# Patient Record
Sex: Male | Born: 1945 | Race: Black or African American | Hispanic: No | Marital: Married
Health system: Southern US, Community
[De-identification: ages and names within clinical notes are randomized; demographics above are authoritative.]

---

## 2020-05-11 ENCOUNTER — Other Ambulatory Visit (HOSPITAL_COMMUNITY): Payer: Self-pay | Admitting: Family Medicine

## 2020-05-11 ENCOUNTER — Other Ambulatory Visit: Payer: Self-pay | Admitting: Family Medicine

## 2020-05-11 DIAGNOSIS — R05 Cough: Secondary | ICD-10-CM

## 2020-05-11 DIAGNOSIS — R059 Cough, unspecified: Secondary | ICD-10-CM

## 2020-05-19 ENCOUNTER — Other Ambulatory Visit (HOSPITAL_COMMUNITY): Payer: Self-pay | Admitting: Family Medicine

## 2020-05-19 DIAGNOSIS — Z0189 Encounter for other specified special examinations: Secondary | ICD-10-CM

## 2020-05-19 DIAGNOSIS — F17200 Nicotine dependence, unspecified, uncomplicated: Secondary | ICD-10-CM

## 2020-05-30 ENCOUNTER — Other Ambulatory Visit (HOSPITAL_COMMUNITY): Payer: Self-pay | Admitting: Family Medicine

## 2020-05-30 ENCOUNTER — Other Ambulatory Visit: Payer: Self-pay

## 2020-05-30 ENCOUNTER — Ambulatory Visit (HOSPITAL_COMMUNITY)
Admission: RE | Admit: 2020-05-30 | Discharge: 2020-05-30 | Disposition: A | Discharge status: Home / Self Care | Payer: No Typology Code available for payment source | Source: Ambulatory Visit | Attending: Family Medicine | Admitting: Family Medicine

## 2020-05-30 DIAGNOSIS — Z0189 Encounter for other specified special examinations: Secondary | ICD-10-CM

## 2020-05-30 DIAGNOSIS — F17200 Nicotine dependence, unspecified, uncomplicated: Secondary | ICD-10-CM | POA: Diagnosis not present

## 2020-06-05 ENCOUNTER — Ambulatory Visit (HOSPITAL_COMMUNITY): Admission: RE | Admit: 2020-06-05 | Payer: No Typology Code available for payment source | Source: Ambulatory Visit

## 2020-06-05 ENCOUNTER — Encounter (HOSPITAL_COMMUNITY): Payer: Self-pay

## 2020-12-01 ENCOUNTER — Other Ambulatory Visit: Payer: Self-pay | Admitting: Neurology

## 2020-12-01 ENCOUNTER — Other Ambulatory Visit (HOSPITAL_COMMUNITY): Payer: Self-pay | Admitting: Neurology

## 2020-12-01 DIAGNOSIS — M5412 Radiculopathy, cervical region: Secondary | ICD-10-CM

## 2020-12-14 ENCOUNTER — Other Ambulatory Visit: Payer: Self-pay

## 2020-12-14 ENCOUNTER — Ambulatory Visit (HOSPITAL_COMMUNITY)
Admission: RE | Admit: 2020-12-14 | Discharge: 2020-12-14 | Disposition: A | Discharge status: Home / Self Care | Payer: No Typology Code available for payment source | Source: Ambulatory Visit | Attending: Neurology | Admitting: Neurology

## 2020-12-14 DIAGNOSIS — M5412 Radiculopathy, cervical region: Secondary | ICD-10-CM | POA: Insufficient documentation

## 2021-02-16 ENCOUNTER — Ambulatory Visit (HOSPITAL_COMMUNITY): Payer: No Typology Code available for payment source | Attending: Neurology

## 2021-02-16 ENCOUNTER — Encounter (HOSPITAL_COMMUNITY): Payer: Self-pay

## 2021-02-16 ENCOUNTER — Telehealth (HOSPITAL_COMMUNITY): Payer: Self-pay

## 2021-02-16 ENCOUNTER — Other Ambulatory Visit: Payer: Self-pay

## 2021-02-16 DIAGNOSIS — M25511 Pain in right shoulder: Secondary | ICD-10-CM | POA: Diagnosis present

## 2021-02-16 DIAGNOSIS — R29898 Other symptoms and signs involving the musculoskeletal system: Secondary | ICD-10-CM | POA: Insufficient documentation

## 2021-02-16 DIAGNOSIS — M25611 Stiffness of right shoulder, not elsewhere classified: Secondary | ICD-10-CM | POA: Insufficient documentation

## 2021-02-16 DIAGNOSIS — G8929 Other chronic pain: Secondary | ICD-10-CM | POA: Diagnosis present

## 2021-02-16 NOTE — Patient Instructions (Addendum)
SCAPULAR DEPRESSIONS  Start by squeezing your shoulder blades together. Next, push your arm down towards the floor. As you do this, your shoulder should drop a few inches.   Keep your elbow straight and wrists extended the entire time. Complete 10 times. Multiple times a day.    1) Seated Row   Sit up straight with elbows by your sides. Pull back with shoulders/elbows, keeping forearms straight, as if pulling back on the reins of a horse. Squeeze shoulder blades together. Repeat _10__times, __2-3__sets/day          1) SHOULDER: Flexion On Table   Place hands on towel placed on table, elbows straight. Lean forward with you upper body, pushing towel away from body.  _10__ reps per set, __2-3_ sets per day  2) Abduction (Passive)   With arm out to side, resting on towel placed on table with palm DOWN, keeping trunk away from table, lean to the side while pushing towel away from body.  Repeat _10___ times. Do _2-3___ sessions per day.  Copyright  VHI. All rights reserved.     3) Internal Rotation (Assistive)   Seated with elbow bent at right angle and held against side, slide arm on table surface in an inward arc keeping elbow anchored in place. Repeat __10__ times. Do _2-3___ sessions per day. Activity: Use this motion to brush crumbs off the table.  Copyright  VHI. All rights reserved.

## 2021-02-16 NOTE — Therapy (Signed)
[facility] [address] [place], [place], [address] Phone: [phone]   Fax:  [phone]  Occupational Therapy Evaluation  Patient Details  Name: Jonathan Baldwin MRN: [id number] Date of Birth: May 31, 1946 Referring Provider (OT): Beryle Beams, MD   Encounter Date: 02/16/2021   OT End of Session - 02/16/21 1135    Visit Number 1    Number of Visits 8    Date for OT Re-Evaluation 03/16/21    Authorization Type [facility] - Waiting to hear about authorization. Unknown at time of evaluation.    OT Start Time 0815    OT Stop Time 0900    OT Time Calculation (min) 45 min    Activity Tolerance Patient limited by pain    Behavior During Therapy Anxious           History reviewed. No pertinent past medical history.  History reviewed. No pertinent surgical history.  There were no vitals filed for this visit.   Subjective Assessment - 02/16/21 0822    Subjective  S: I can't move it like I could and it hurts a lot more.    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 Patient reports that he received therapy post CVA and his right arm was functional and able to at least reach to shoulder level without difficulty. He did not have any pain. In the past 5 years his right shoulder began have more pain when he attempted to use it and his ROM decreased as well. Dr. Gerilyn Pilgrim has referred patient to occupational therapy for evaluation and treatment.    Patient Stated Goals To have decreased pain and be able to play golf with better mobility of his right arm.    Currently in Pain? No/denies   no pain. Although when raising his arm to shoulder level it's an 8/10.            [facility] OT Assessment - 02/16/21 0823      Assessment   Medical Diagnosis Right shoulder pain/stiffness    Referring Provider (OT) Beryle Beams, MD    Onset Date/Surgical Date --   approximately 5 years ago  started to get worse.   Hand Dominance Left    Next MD Visit --   in 6 months   Prior Therapy patient received therapy services after his stroke 20 years ago.      Precautions   Precautions None      Restrictions   Weight Bearing Restrictions No      Balance Screen   Has the patient fallen in the past 6 months No      Home  Environment   Family/patient expects to be discharged to: Private residence      Prior Function   Level of Independence Independent    Vocation Retired      ADL   ADL comments Difficulty playing golf, reaching up overhead.      Mobility   Mobility Status Independent      Written Expression   Dominant Hand Left      Vision - History   Baseline Vision No visual deficits      Cognition   Overall Cognitive Status Within Functional Limits for tasks assessed      Observation/Other Assessments   Focus on Therapeutic Outcomes (FOTO)  N/A      Posture/Postural Control   Posture/Postural Control Postural limitations  Postural Limitations Rounded Shoulders;Forward head      ROM / Strength   AROM / PROM / Strength AROM;Strength;PROM      Palpation   Palpation comment Moderate fascial restrictions in the right upper arm upper trapezius, and scapularis region.      AROM   Overall AROM Comments Assessed seated. IR/er adducted    AROM Assessment Site Shoulder    Right/Left Shoulder Right    Right Shoulder Flexion 80 Degrees    Right Shoulder ABduction 75 Degrees    Right Shoulder Internal Rotation 90 Degrees    Right Shoulder External Rotation 40 Degrees      PROM   Overall PROM Comments Assessed supine. IR/er adducted    PROM Assessment Site Shoulder    Right/Left Shoulder Right    Right Shoulder Flexion 142 Degrees    Right Shoulder ABduction 134 Degrees    Right Shoulder Internal Rotation 90 Degrees    Right Shoulder External Rotation 60 Degrees      Strength   Overall Strength Comments Assessed seated. IR/er adducted    Strength  Assessment Site Shoulder    Right/Left Shoulder Right    Right Shoulder Flexion 3-/5    Right Shoulder ABduction 3-/5    Right Shoulder Internal Rotation 3/5    Right Shoulder External Rotation 3-/5                           OT Education - 02/16/21 1121    Education Details table slides, scapular depression, scapular row    Person(s) Educated Patient    Methods Explanation;Demonstration;Handout;Verbal cues;Tactile cues    Comprehension Verbalized understanding;Returned demonstration            OT Short Term Goals - 02/16/21 1144      OT SHORT TERM GOAL #1   Title Patient will be educated and independent with HEP in order to faciliate his progress in therapy and return to his right UE as his nondominant extremity for all daily tasks.    Time 4    Period Weeks    Status New    Target Date 03/16/21      OT SHORT TERM GOAL #2   Title Patient will increase his RUE A/ROM to 50% range as possible in order to increase his ability to swing his golf club with less difficulty.    Time 4    Period Weeks    Status New      OT SHORT TERM GOAL #3   Title Patient will report a decrease in pain level of approximately 4/10 or less when using his RUE during reaching tasks.    Time 4    Period Weeks    Status New      OT SHORT TERM GOAL #4   Title Patient will decrease his right UE fascial restrictions to min amount in order to increase his functional mobility needed to complete reaching at shoulder level.    Time 4    Period Weeks    Status New                    Plan - 02/16/21 1137    Clinical Impression Statement A: Patient is a 75 y/o male S/P right shoulder/neck pain causing decrease ROM and strength. Patient presents with severe muscle guarding and resistance to movement which in turn causes increased pain. Required increased time and VC to relax the RUE enough to measure passive ROM.  Education was provided on the importance of relaxing the arm and  refrain from keeping it in a guarded position. Decreased body awareness noted during evaluation as patient was unaware when it was himself or therapist that was moving his arm. Pt believes that after his rehab post CVA he was able to achieve at least shoulder level height with his UE and did not have pain.    OT Occupational Profile and History Problem Focused Assessment - Including review of records relating to presenting problem    Occupational performance deficits (Please refer to evaluation for details): ADL's;Leisure    Body Structure / Function / Physical Skills ADL;Strength;Pain;ROM;Mobility;Fascial restriction;UE functional use;Tone    Rehab Potential Good    Clinical Decision Making Several treatment options, min-mod task modification necessary    Comorbidities Affecting Occupational Performance: Presence of comorbidities impacting occupational performance    Comorbidities impacting occupational performance description: history of CVA with right side affected.    Modification or Assistance to Complete Evaluation  Min-Moderate modification of tasks or assist with assess necessary to complete eval    OT Frequency 2x / week    OT Duration 4 weeks    OT Treatment/Interventions Self-care/ADL training;Moist Heat;DME and/or AE instruction;Therapeutic activities;Therapeutic exercise;Ultrasound;Cryotherapy;Electrical Stimulation;Energy conservation;Manual Therapy;Patient/family education;Passive range of motion;Neuromuscular education    Plan P: Patient will benefit from skilled OT services to increase functional use of his right UE while completing self care and desired leisure takss. Treatment plan: Myofascial release, manual stretching, P/ROM, AA/ROM, A/ROM, general scapular strengthening. Modalities PRN.    OT Home Exercise Plan eval: table slides, scapular row A/ROM, scapular depression    Consulted and Agree with Plan of Care Patient           Patient will benefit from skilled therapeutic  intervention in order to improve the following deficits and impairments:   Body Structure / Function / Physical Skills: ADL,Strength,Pain,ROM,Mobility,Fascial restriction,UE functional use,Tone       Visit Diagnosis: Other symptoms and signs involving the musculoskeletal system  Stiffness of right shoulder, not elsewhere classified  Chronic right shoulder pain    Problem List There are no problems to display for this patient.   Jonathan Baldwin, OTR/L,CBIS  [phone]  02/16/2021, 11:49 AM  [facility] [address] [place], [place], [address] Phone: [phone]   Fax:  [phone]  Name: Jonathan Baldwin MRN: [id number] Date of Birth: June 08, 1946

## 2021-02-16 NOTE — Telephone Encounter (Signed)
Jonathan Baldwin call back from MD office she has spoken with [facility] - Patient's request is in review to be approved. FOTO (L/m at MD office for Jonathan Baldwin B Ext [phone] -we need [facility] Auth from her office.) [facility]

## 2021-02-21 ENCOUNTER — Ambulatory Visit (HOSPITAL_COMMUNITY): Payer: No Typology Code available for payment source | Admitting: Occupational Therapy

## 2021-02-26 ENCOUNTER — Other Ambulatory Visit: Payer: Self-pay

## 2021-02-26 ENCOUNTER — Ambulatory Visit (HOSPITAL_COMMUNITY): Payer: No Typology Code available for payment source | Attending: Neurology

## 2021-02-26 DIAGNOSIS — G8929 Other chronic pain: Secondary | ICD-10-CM | POA: Diagnosis present

## 2021-02-26 DIAGNOSIS — M25611 Stiffness of right shoulder, not elsewhere classified: Secondary | ICD-10-CM | POA: Insufficient documentation

## 2021-02-26 DIAGNOSIS — M25511 Pain in right shoulder: Secondary | ICD-10-CM | POA: Insufficient documentation

## 2021-02-26 DIAGNOSIS — R29898 Other symptoms and signs involving the musculoskeletal system: Secondary | ICD-10-CM | POA: Diagnosis present

## 2021-02-27 NOTE — Therapy (Signed)
[facility] [address] [place], [place], [address] Phone: [phone]   Fax:  [phone]  Occupational Therapy Treatment  Patient Details  Name: Jonathan Baldwin MRN: [id number] Date of Birth: [birth year] Referring Provider (OT): Beryle Beams, MD   Encounter Date: 02/26/2021   OT End of Session - 02/27/21 0809    Visit Number 2    Number of Visits 8    Date for OT Re-Evaluation 03/16/21    Authorization Type [facility] - Waiting to hear about authorization. Unknown at time of evaluation.    OT Start Time 1645    OT Stop Time 1723    OT Time Calculation (min) 38 min    Activity Tolerance Patient limited by pain;Patient tolerated treatment well    Behavior During Therapy Anxious           No past medical history on file.  No past surgical history on file.  There were no vitals filed for this visit.   Subjective Assessment - 02/26/21 1652    Subjective  S: I haven't been doing my exercises as much; I'll be honest.    Currently in Pain? Yes    Pain Score 4     Pain Location Shoulder    Pain Orientation Right    Pain Descriptors / Indicators Tingling    Pain Type Chronic pain    Pain Radiating Towards to neck    Pain Onset More than a month ago    Pain Frequency Occasional    Aggravating Factors  with increased use.    Pain Relieving Factors exercise    Effect of Pain on Daily Activities severe effect    Multiple Pain Sites No              [facility] OT Assessment - 02/26/21 1653      Assessment   Medical Diagnosis Right shoulder pain/stiffness      Precautions   Precautions None                    OT Treatments/Exercises (OP) - 02/26/21 1654      Exercises   Exercises Shoulder      Shoulder Exercises: Supine   Protraction PROM;AAROM;5 reps    Horizontal ABduction PROM;5 reps    External Rotation PROM;5 reps    Internal Rotation PROM;5 reps    Flexion PROM;5 reps    ABduction PROM;5 reps       Shoulder Exercises: Seated   Extension AROM;10 reps    Row AROM;10 reps    Other Seated Exercises scapular depression; 10X A/ROM      Shoulder Exercises: Therapy Ball   Flexion 10 reps   2" hold at end stretch   ABduction 10 reps   2" hold at end stretch     Shoulder Exercises: ROM/Strengthening   Thumb Tacks 1' low level      Manual Therapy   Manual Therapy Myofascial release    Manual therapy comments Manual therapy completed prior to exercises.    Myofascial Release Myofascial release and manual stretching completed to right upper arm, upper trapezius, and scapularis region to decrease fascial restrictions and increase joint mobility in a pain free zone.                  OT Education - 02/27/21 0809    Education Details reviewed therapy goals    Person(s) Educated Patient    Methods Explanation    Comprehension Verbalized understanding  OT Short Term Goals - 02/26/21 1650      OT SHORT TERM GOAL #1   Title Patient will be educated and independent with HEP in order to faciliate his progress in therapy and return to his right UE as his nondominant extremity for all daily tasks.    Time 4    Period Weeks    Status On-going    Target Date 03/16/21      OT SHORT TERM GOAL #2   Title Patient will increase his RUE A/ROM to 50% range as possible in order to increase his ability to swing his golf club with less difficulty.    Time 4    Period Weeks    Status On-going      OT SHORT TERM GOAL #3   Title Patient will report a decrease in pain level of approximately 4/10 or less when using his RUE during reaching tasks.    Time 4    Period Weeks    Status On-going      OT SHORT TERM GOAL #4   Title Patient will decrease his right UE fascial restrictions to min amount in order to increase his functional mobility needed to complete reaching at shoulder level.    Time 4    Period Weeks    Status On-going                    Plan - 02/27/21  0810    Clinical Impression Statement A: Initiated gentle myofascial release and passive stretching. patient continues to demonstrate muscle guarding and has difficulty with muscle relaxation during P/ROM. VC were provided throughout myofascial release. Continues to demonstrate increased hesitancy with movement in his right UE even when he is completing it. He did demonstrate more range of motion as session progressed. VC for form and technique were provided throughout session.    Body Structure / Function / Physical Skills ADL;Strength;Pain;ROM;Mobility;Fascial restriction;UE functional use;Tone    Plan P: Continue to work on increasing tolerance to movement to RUE. Progress ROM as able gradually. Gentle myofascial release (very sensitive!). Attempt standing table wash.    Consulted and Agree with Plan of Care Patient           Patient will benefit from skilled therapeutic intervention in order to improve the following deficits and impairments:   Body Structure / Function / Physical Skills: ADL,Strength,Pain,ROM,Mobility,Fascial restriction,UE functional use,Tone       Visit Diagnosis: Other symptoms and signs involving the musculoskeletal system  Stiffness of right shoulder, not elsewhere classified  Chronic right shoulder pain    Problem List There are no problems to display for this patient.   Limmie Patricia, OTR/L,CBIS  [phone]  02/27/2021, 8:16 AM   [facility] [address] [place], [place], [address] Phone: [phone]   Fax:  [phone]  Name: Jonathan Baldwin MRN: [id number] Date of Birth: 1946-02-04

## 2021-02-28 ENCOUNTER — Other Ambulatory Visit: Payer: Self-pay

## 2021-02-28 ENCOUNTER — Encounter (HOSPITAL_COMMUNITY): Payer: Self-pay | Admitting: Occupational Therapy

## 2021-02-28 ENCOUNTER — Ambulatory Visit (HOSPITAL_COMMUNITY): Payer: No Typology Code available for payment source | Admitting: Occupational Therapy

## 2021-02-28 DIAGNOSIS — M25611 Stiffness of right shoulder, not elsewhere classified: Secondary | ICD-10-CM

## 2021-02-28 DIAGNOSIS — R29898 Other symptoms and signs involving the musculoskeletal system: Secondary | ICD-10-CM

## 2021-02-28 DIAGNOSIS — M25511 Pain in right shoulder: Secondary | ICD-10-CM

## 2021-02-28 NOTE — Therapy (Signed)
[facility] [address] [place], [place], [address] Phone: [phone]   Fax:  [phone]  Occupational Therapy Treatment  Patient Details  Name: Jonathan Baldwin MRN: [id number] Date of Birth: 05/23/1946 Referring Provider (OT): Beryle Beams, MD   Encounter Date: 02/28/2021   OT End of Session - 02/28/21 0938    Visit Number 3    Number of Visits 8    Date for OT Re-Evaluation 03/16/21    Authorization Type [facility] - Waiting to hear about authorization. Unknown at time of evaluation.    OT Start Time 0900    OT Stop Time 0938    OT Time Calculation (min) 38 min    Activity Tolerance Patient limited by pain;Patient tolerated treatment well    Behavior During Therapy Anxious           History reviewed. No pertinent past medical history.  History reviewed. No pertinent surgical history.  There were no vitals filed for this visit.   Subjective Assessment - 02/28/21 0859    Subjective  S: It's just feeling stiff.    Currently in Pain? No/denies              [facility] OT Assessment - 02/28/21 0859      Assessment   Medical Diagnosis Right shoulder pain/stiffness      Precautions   Precautions None                    OT Treatments/Exercises (OP) - 02/28/21 0859      Exercises   Exercises Shoulder      Shoulder Exercises: Supine   Protraction PROM;5 reps;AAROM;10 reps    Horizontal ABduction PROM;5 reps    External Rotation PROM;5 reps;AAROM;10 reps    Internal Rotation PROM;5 reps;AAROM;10 reps    Flexion PROM;AAROM;5 reps    ABduction PROM;5 reps      Shoulder Exercises: Seated   Extension AROM;10 reps    Row AROM;10 reps    Other Seated Exercises scapular depression; 10X A/ROM      Shoulder Exercises: Standing   Other Standing Exercises standing table wash, 1'      Shoulder Exercises: Therapy Ball   Flexion 10 reps    ABduction 10 reps      Manual Therapy   Manual Therapy Myofascial  release    Manual therapy comments Manual therapy completed prior to exercises.    Myofascial Release Myofascial release and manual stretching completed to right upper arm, upper trapezius, and scapularis region to decrease fascial restrictions and increase joint mobility in a pain free zone.                    OT Short Term Goals - 02/26/21 1650      OT SHORT TERM GOAL #1   Title Patient will be educated and independent with HEP in order to faciliate his progress in therapy and return to his right UE as his nondominant extremity for all daily tasks.    Time 4    Period Weeks    Status On-going    Target Date 03/16/21      OT SHORT TERM GOAL #2   Title Patient will increase his RUE A/ROM to 50% range as possible in order to increase his ability to swing his golf club with less difficulty.    Time 4    Period Weeks    Status On-going      OT SHORT TERM GOAL #  3   Title Patient will report a decrease in pain level of approximately 4/10 or less when using his RUE during reaching tasks.    Time 4    Period Weeks    Status On-going      OT SHORT TERM GOAL #4   Title Patient will decrease his right UE fascial restrictions to min amount in order to increase his functional mobility needed to complete reaching at shoulder level.    Time 4    Period Weeks    Status On-going                    Plan - 02/28/21 0350    Clinical Impression Statement A: Continued gentle myofascial release to address fascial restrictions, passive stretching. Pt with muscle guarding however was able to relax somewhat with consistent verbal cuing. Continued with AA/ROM in supine, increasing to 10 reps for protraction, adding flexion and er/IR today. Pt with improvement in smoothness of movement however continues to grip the dowel rod very tightly which increased his pain with movement. Added standing table wash, continued with therapy ball stretches. Verbal cuing for form and technique.    Body  Structure / Function / Physical Skills ADL;Strength;Pain;ROM;Mobility;Fascial restriction;UE functional use;Tone    Plan P: Continue with manual techniques and passive stretching, AA/ROM working towards 10 reps of each task, attempt pulleys    OT Home Exercise Plan eval: table slides, scapular row A/ROM, scapular depression    Consulted and Agree with Plan of Care Patient           Patient will benefit from skilled therapeutic intervention in order to improve the following deficits and impairments:   Body Structure / Function / Physical Skills: ADL,Strength,Pain,ROM,Mobility,Fascial restriction,UE functional use,Tone       Visit Diagnosis: Other symptoms and signs involving the musculoskeletal system  Stiffness of right shoulder, not elsewhere classified  Chronic right shoulder pain    Problem List There are no problems to display for this patient.  Ezra Sites, OTR/L  [phone] 02/28/2021, 9:39 AM  [facility] [address] [place], [place], [address] Phone: [phone]   Fax:  [phone]  Name: Jonathan Baldwin MRN: [id number] Date of Birth: December 26, 1945

## 2021-03-05 ENCOUNTER — Other Ambulatory Visit: Payer: Self-pay

## 2021-03-05 ENCOUNTER — Ambulatory Visit (HOSPITAL_COMMUNITY): Payer: No Typology Code available for payment source

## 2021-03-05 ENCOUNTER — Encounter (HOSPITAL_COMMUNITY): Payer: Self-pay

## 2021-03-05 DIAGNOSIS — R29898 Other symptoms and signs involving the musculoskeletal system: Secondary | ICD-10-CM

## 2021-03-05 DIAGNOSIS — M25611 Stiffness of right shoulder, not elsewhere classified: Secondary | ICD-10-CM

## 2021-03-05 DIAGNOSIS — M25511 Pain in right shoulder: Secondary | ICD-10-CM

## 2021-03-05 NOTE — Therapy (Signed)
[facility] [address] [place], [place], [address] Phone: [phone]   Fax:  [phone]  Occupational Therapy Treatment  Patient Details  Name: Jonathan Baldwin MRN: [id number] Date of Birth: Nov 20, 1945 Referring Provider (OT): Beryle Beams, MD   Encounter Date: 03/05/2021   OT End of Session - 03/05/21 1027     Visit Number 4    Number of Visits 8    Date for OT Re-Evaluation 03/16/21    Authorization Type [facility] - Waiting to hear about authorization. Unknown at time of evaluation.    OT Start Time [phone]    OT Stop Time 1025    OT Time Calculation (min) 38 min    Activity Tolerance Patient tolerated treatment well    Behavior During Therapy [facility] for tasks assessed/performed             History reviewed. No pertinent past medical history.  History reviewed. No pertinent surgical history.  There were no vitals filed for this visit.   Subjective Assessment - 03/05/21 1003     Subjective  S: I didn't even realize it but I used this arm to reach into the fridge for something the other day.    Currently in Pain? No/denies                [facility] OT Assessment - 03/05/21 1004       Assessment   Medical Diagnosis Right shoulder pain/stiffness      Precautions   Precautions None                      OT Treatments/Exercises (OP) - 03/05/21 1004       Exercises   Exercises Shoulder      Shoulder Exercises: Supine   Protraction PROM;5 reps;AAROM;10 reps    Horizontal ABduction PROM;5 reps;AAROM;10 reps    External Rotation PROM;5 reps;AAROM;10 reps    Internal Rotation PROM;5 reps;AAROM;10 reps    Flexion PROM;5 reps;AAROM;10 reps    ABduction PROM;5 reps      Shoulder Exercises: Standing   ABduction AAROM;10 reps      Shoulder Exercises: Pulleys   Flexion 1 minute   standing   ABduction 1 minute   standing     Shoulder Exercises: Therapy Ball   Flexion 10 reps   2" hold at end  stretch   ABduction 10 reps   2" hold at end stretch     Manual Therapy   Manual Therapy Myofascial release    Manual therapy comments Manual therapy completed prior to exercises.    Myofascial Release Myofascial release and manual stretching completed to right upper arm, upper trapezius, and scapularis region to decrease fascial restrictions and increase joint mobility in a pain free zone.                    OT Education - 03/05/21 1027     Education Details AA/ROM supine except Abduction complete standing.    Person(s) Educated Patient    Methods Explanation;Demonstration;Verbal cues;Handout    Comprehension Returned demonstration;Verbalized understanding              OT Short Term Goals - 02/26/21 1650       OT SHORT TERM GOAL #1   Title Patient will be educated and independent with HEP in order to faciliate his progress in therapy and return to his right UE as his nondominant extremity for all daily tasks.  Time 4    Period Weeks    Status On-going    Target Date 03/16/21      OT SHORT TERM GOAL #2   Title Patient will increase his RUE A/ROM to 50% range as possible in order to increase his ability to swing his golf club with less difficulty.    Time 4    Period Weeks    Status On-going      OT SHORT TERM GOAL #3   Title Patient will report a decrease in pain level of approximately 4/10 or less when using his RUE during reaching tasks.    Time 4    Period Weeks    Status On-going      OT SHORT TERM GOAL #4   Title Patient will decrease his right UE fascial restrictions to min amount in order to increase his functional mobility needed to complete reaching at shoulder level.    Time 4    Period Weeks    Status On-going                      Plan - 03/05/21 1028     Clinical Impression Statement A: Pt demonstrates further P/ROM range this session with less muscle guarding and resistance. Required VC to relax as needed approximately 10% of  the time. Able to complete all AA/ROM supine except for abduction which was completed standing for better form and technique. Added pulleys with tactile cueing to maintain proper form and technique. Upgraded HEP for AA/ROM.    Body Structure / Function / Physical Skills ADL;Strength;Pain;ROM;Mobility;Fascial restriction;UE functional use;Tone    Plan P: Follow up on HEP. Continue with AA/ROM supine. Attempt wall wash or resume table top wash.    OT Home Exercise Plan eval: table slides, scapular row A/ROM, scapular depression 6/13: AA/ROM    Consulted and Agree with Plan of Care Patient             Patient will benefit from skilled therapeutic intervention in order to improve the following deficits and impairments:   Body Structure / Function / Physical Skills: ADL, Strength, Pain, ROM, Mobility, Fascial restriction, UE functional use, Tone       Visit Diagnosis: Chronic right shoulder pain  Other symptoms and signs involving the musculoskeletal system  Stiffness of right shoulder, not elsewhere classified    Problem List There are no problems to display for this patient.   Limmie Patricia, OTR/L,CBIS  [phone]   03/05/2021, 10:30 AM  [facility] [address] [place], [place], [address] Phone: [phone]   Fax:  [phone]  Name: Jonathan Baldwin MRN: [id number] Date of Birth: 1946/03/06

## 2021-03-05 NOTE — Patient Instructions (Signed)
Perform each exercise ____10____ reps. 1-2x days.  DO them laying except for the last one.   1) Protraction   Start by holding a wand or cane at chest height.  Next, slowly push the wand outwards in front of your body so that your elbows become fully straightened. Then, return to the original position.     2) Shoulder FLEXION   In the standing position, hold a wand/cane with both arms, palms down on both sides. Raise up the wand/cane allowing your unaffected arm to perform most of the effort. Your affected arm should be partially relaxed.      3) Internal/External ROTATION   In the standing position, hold a wand/cane with both hands keeping your elbows bent. Move your arms and wand/cane to one side.  Your affected arm should be partially relaxed while your unaffected arm performs most of the effort.           5) Horizontal Abduction/Adduction      Straight arms holding cane at shoulder height, bring cane to right, center, left. Repeat starting to left.   Copyright  [employer]. All rights reserved.    ) Shoulder ABDUCTION - stand up for this one.  While holding a wand/cane palm face up on the injured side and palm face down on the uninjured side, slowly raise up your injured arm to the side.

## 2021-03-07 ENCOUNTER — Encounter (HOSPITAL_COMMUNITY): Payer: Self-pay

## 2021-03-07 ENCOUNTER — Ambulatory Visit (HOSPITAL_COMMUNITY): Payer: No Typology Code available for payment source

## 2021-03-07 ENCOUNTER — Other Ambulatory Visit: Payer: Self-pay

## 2021-03-07 DIAGNOSIS — R29898 Other symptoms and signs involving the musculoskeletal system: Secondary | ICD-10-CM | POA: Diagnosis not present

## 2021-03-07 DIAGNOSIS — M25511 Pain in right shoulder: Secondary | ICD-10-CM

## 2021-03-07 DIAGNOSIS — M25611 Stiffness of right shoulder, not elsewhere classified: Secondary | ICD-10-CM

## 2021-03-07 NOTE — Therapy (Signed)
[facility] [address] [place], [place], [address] Phone: [phone]   Fax:  [phone]  Occupational Therapy Treatment  Patient Details  Name: Jonathan Baldwin MRN: [id number] Date of Birth: 06/29/1946 Referring Provider (OT): Beryle Beams, MD   Encounter Date: 03/07/2021   OT End of Session - 03/07/21 1012     Visit Number 5    Number of Visits 8    Date for OT Re-Evaluation 03/16/21    Authorization Type [facility] - Waiting to hear about authorization. Unknown at time of evaluation.    OT Start Time 0945    OT Stop Time 1023    OT Time Calculation (min) 38 min    Activity Tolerance Patient tolerated treatment well    Behavior During Therapy [facility] for tasks assessed/performed             History reviewed. No pertinent past medical history.  History reviewed. No pertinent surgical history.  There were no vitals filed for this visit.   Subjective Assessment - 03/07/21 0948     Subjective  S: I can tell it's getting so much better. I'm actually trying to move it.    Currently in Pain? Yes    Pain Score 5     Pain Location Shoulder    Pain Orientation Right    Pain Descriptors / Indicators Sore    Pain Type Chronic pain    Pain Onset Today    Aggravating Factors  way he slept possibly    Pain Relieving Factors exercise    Effect of Pain on Daily Activities mod effect    Multiple Pain Sites No                [facility] OT Assessment - 03/07/21 1006       Assessment   Medical Diagnosis Right shoulder pain/stiffness      Precautions   Precautions None                      OT Treatments/Exercises (OP) - 03/07/21 1006       Exercises   Exercises Shoulder      Shoulder Exercises: Supine   Protraction PROM;5 reps;AAROM;10 reps    Horizontal ABduction PROM;5 reps;AAROM;10 reps    External Rotation PROM;5 reps;AAROM;10 reps    Internal Rotation PROM;5 reps;AAROM;10 reps    Flexion PROM;5  reps;AAROM;10 reps    ABduction PROM;5 reps      Shoulder Exercises: Standing   ABduction AAROM;10 reps      Shoulder Exercises: Pulleys   Flexion 1 minute   standing   ABduction 1 minute   standing     Shoulder Exercises: Therapy Ball   Flexion --   12X with 2" hold at end stretch   ABduction --   12X with 2" hold at end stretch     Shoulder Exercises: ROM/Strengthening   Wall Wash 1'    Other ROM/Strengthening Exercises PVC pipe slide 10X flexion      Manual Therapy   Manual Therapy Myofascial release    Manual therapy comments Manual therapy completed prior to exercises.    Myofascial Release Myofascial release and manual stretching completed to right upper arm, upper trapezius, and scapularis region to decrease fascial restrictions and increase joint mobility in a pain free zone.                      OT Short Term Goals - 02/26/21  1650       OT SHORT TERM GOAL #1   Title Patient will be educated and independent with HEP in order to faciliate his progress in therapy and return to his right UE as his nondominant extremity for all daily tasks.    Time 4    Period Weeks    Status On-going    Target Date 03/16/21      OT SHORT TERM GOAL #2   Title Patient will increase his RUE A/ROM to 50% range as possible in order to increase his ability to swing his golf club with less difficulty.    Time 4    Period Weeks    Status On-going      OT SHORT TERM GOAL #3   Title Patient will report a decrease in pain level of approximately 4/10 or less when using his RUE during reaching tasks.    Time 4    Period Weeks    Status On-going      OT SHORT TERM GOAL #4   Title Patient will decrease his right UE fascial restrictions to min amount in order to increase his functional mobility needed to complete reaching at shoulder level.    Time 4    Period Weeks    Status On-going                      Plan - 03/07/21 1029     Clinical Impression Statement A:  Pt continues to demonstrate improvement with P/ROM as he is able to tolerate ROM that is [facility] when passively stretched. Does has some discomfort at end stretch. Able to complete wall wash and pvc pipe slide while reaching almost shoulder level height for range. Manual techniques were completed to address min fascial restrictions at the right upper arm, anterior deltoid, AC joint, and upper trapezius. VC for form and technique were provided during session.    Body Structure / Function / Physical Skills ADL;Strength;Pain;ROM;Mobility;Fascial restriction;UE functional use;Tone    Plan P: Attempt standing AA/ROM with use of mirror for visual feedback on posture.    Consulted and Agree with Plan of Care Patient             Patient will benefit from skilled therapeutic intervention in order to improve the following deficits and impairments:   Body Structure / Function / Physical Skills: ADL, Strength, Pain, ROM, Mobility, Fascial restriction, UE functional use, Tone       Visit Diagnosis: Stiffness of right shoulder, not elsewhere classified  Other symptoms and signs involving the musculoskeletal system  Chronic right shoulder pain    Problem List There are no problems to display for this patient.   Limmie Patricia, OTR/L,CBIS  [phone]   03/07/2021, 10:32 AM  [facility] [address] [place], [place], [address] Phone: [phone]   Fax:  [phone]  Name: Kiara Keep MRN: [id number] Date of Birth: 01-07-1946

## 2021-03-12 ENCOUNTER — Encounter (HOSPITAL_COMMUNITY): Payer: Self-pay

## 2021-03-12 ENCOUNTER — Ambulatory Visit (HOSPITAL_COMMUNITY): Payer: No Typology Code available for payment source

## 2021-03-12 ENCOUNTER — Other Ambulatory Visit: Payer: Self-pay

## 2021-03-12 DIAGNOSIS — R29898 Other symptoms and signs involving the musculoskeletal system: Secondary | ICD-10-CM

## 2021-03-12 DIAGNOSIS — M25511 Pain in right shoulder: Secondary | ICD-10-CM

## 2021-03-12 DIAGNOSIS — M25611 Stiffness of right shoulder, not elsewhere classified: Secondary | ICD-10-CM

## 2021-03-12 NOTE — Therapy (Signed)
[facility] [address] [place], [place], [address] Phone: [phone]   Fax:  [phone]  Occupational Therapy Treatment  Patient Details  Name: Jonathan Baldwin MRN: [id number] Date of Birth: September 29, 1945 Referring Provider (OT): Beryle Beams, MD   Encounter Date: 03/12/2021   OT End of Session - 03/12/21 0935     Visit Number 6    Number of Visits 8    Date for OT Re-Evaluation 03/16/21    Authorization Type [facility] - Waiting to hear about authorization. Unknown at time of evaluation.    OT Start Time 0900    OT Stop Time 0938    OT Time Calculation (min) 38 min    Activity Tolerance Patient tolerated treatment well    Behavior During Therapy [facility] for tasks assessed/performed             History reviewed. No pertinent past medical history.  No past surgical history on file.  There were no vitals filed for this visit.   Subjective Assessment - 03/12/21 0903     Subjective  S; It's feeling good.    Currently in Pain? Yes    Pain Score 5     Pain Location Shoulder    Pain Orientation Right    Pain Descriptors / Indicators Sore    Pain Type Chronic pain    Pain Onset Yesterday    Pain Frequency Occasional    Aggravating Factors  nothing noticeable    Pain Relieving Factors exercise    Effect of Pain on Daily Activities min effect    Multiple Pain Sites No                [facility] OT Assessment - 03/12/21 0934       Assessment   Medical Diagnosis Right shoulder pain/stiffness      Precautions   Precautions None                      OT Treatments/Exercises (OP) - 03/12/21 0916       Exercises   Exercises Shoulder      Shoulder Exercises: Supine   Protraction PROM;5 reps    Horizontal ABduction PROM;5 reps    External Rotation PROM;5 reps    Internal Rotation PROM;5 reps    Flexion PROM;5 reps    ABduction PROM;5 reps      Shoulder Exercises: Standing   Protraction AAROM;10  reps    Horizontal ABduction AAROM;10 reps;Limitations    Horizontal ABduction Limitations lower level    External Rotation AAROM;10 reps    Internal Rotation AAROM;10 reps    Flexion AAROM;10 reps;Limitations    Flexion Limitations just under shoulder level    ABduction AAROM;10 reps      Shoulder Exercises: ROM/Strengthening   UBE (Upper Arm Bike) Level 1 2' forward 2' reverse paceL 3.0-4.0    Wall Wash 1'    Over Head Lace seated. Laced chain from highest level of reach and then removed. Rest breaks taken as needed.      Manual Therapy   Manual Therapy Myofascial release    Manual therapy comments Manual therapy completed prior to exercises.    Myofascial Release Myofascial release and manual stretching completed to right upper arm, upper trapezius, and scapularis region to decrease fascial restrictions and increase joint mobility in a pain free zone.                    OT  Education - 03/12/21 0935     Education Details Complete AA/ROM standing now.    Person(s) Educated Patient    Methods Explanation;Demonstration    Comprehension Verbalized understanding;Returned demonstration              OT Short Term Goals - 02/26/21 1650       OT SHORT TERM GOAL #1   Title Patient will be educated and independent with HEP in order to faciliate his progress in therapy and return to his right UE as his nondominant extremity for all daily tasks.    Time 4    Period Weeks    Status On-going    Target Date 03/16/21      OT SHORT TERM GOAL #2   Title Patient will increase his RUE A/ROM to 50% range as possible in order to increase his ability to swing his golf club with less difficulty.    Time 4    Period Weeks    Status On-going      OT SHORT TERM GOAL #3   Title Patient will report a decrease in pain level of approximately 4/10 or less when using his RUE during reaching tasks.    Time 4    Period Weeks    Status On-going      OT SHORT TERM GOAL #4   Title  Patient will decrease his right UE fascial restrictions to min amount in order to increase his functional mobility needed to complete reaching at shoulder level.    Time 4    Period Weeks    Status On-going                      Plan - 03/12/21 0935     Clinical Impression Statement A: Manual techniques completed to address min fascial restrictions in the right upper arm and upper trapezius region. Demonstrates functional P/ROM this date. Able to complete UBE bike and overhead lacing with moderate difficulty due to muscle fatigue.    Body Structure / Function / Physical Skills ADL;Strength;Pain;ROM;Mobility;Fascial restriction;UE functional use;Tone    Plan P: Reassessment. Discharge with HEP if appropriate. Complete FOTO and review goals.    Consulted and Agree with Plan of Care Patient             Patient will benefit from skilled therapeutic intervention in order to improve the following deficits and impairments:   Body Structure / Function / Physical Skills: ADL, Strength, Pain, ROM, Mobility, Fascial restriction, UE functional use, Tone       Visit Diagnosis: Stiffness of right shoulder, not elsewhere classified  Chronic right shoulder pain  Other symptoms and signs involving the musculoskeletal system    Problem List There are no problems to display for this patient.   Limmie Patricia, OTR/L,CBIS  [phone]   03/12/2021, 9:38 AM  [facility] [address] [place], [place], [address] Phone: [phone]   Fax:  [phone]  Name: Jonathan Baldwin MRN: [id number] Date of Birth: [birth year]

## 2021-03-13 ENCOUNTER — Telehealth (HOSPITAL_COMMUNITY): Payer: Self-pay

## 2021-03-13 NOTE — Telephone Encounter (Signed)
Aram Beecham called again today -checking on AUTH for OT [place] [employer]) she tried to fax it again and it did not come through. Updated IT Ticket and as Aram Beecham to send the authorization by mail.

## 2021-03-14 ENCOUNTER — Other Ambulatory Visit: Payer: Self-pay

## 2021-03-14 ENCOUNTER — Ambulatory Visit (HOSPITAL_COMMUNITY): Payer: No Typology Code available for payment source

## 2021-03-14 ENCOUNTER — Encounter (HOSPITAL_COMMUNITY): Payer: Self-pay

## 2021-03-14 DIAGNOSIS — R29898 Other symptoms and signs involving the musculoskeletal system: Secondary | ICD-10-CM

## 2021-03-14 DIAGNOSIS — M25611 Stiffness of right shoulder, not elsewhere classified: Secondary | ICD-10-CM

## 2021-03-14 DIAGNOSIS — G8929 Other chronic pain: Secondary | ICD-10-CM

## 2021-03-14 NOTE — Therapy (Signed)
[place], [place], [address] Phone: [phone]   Fax:  [phone]  Occupational Therapy Treatment Reassessment/discharge Patient Details  Name: Jonathan Baldwin MRN: [id number] Date of Birth: 12/02/1945 Referring Provider (OT): Phillips Odor, MD   Encounter Date: 03/14/2021   OT End of Session - 03/14/21 1051     Visit Number 7    Number of Visits 8    Date for OT Re-Evaluation 03/16/21    Authorization Type [place] - Waiting to hear about authorization. Unknown at time of evaluation.    OT Start Time [phone]   reassess and discharge   OT Stop Time 1020    OT Time Calculation (min) 30 min    Activity Tolerance Patient tolerated treatment well    Behavior During Therapy [facility] for tasks assessed/performed             History reviewed. No pertinent past medical history.  No past surgical history on file.  There were no vitals filed for this visit.   Subjective Assessment - 03/14/21 0954     Subjective  S: I played the best golf game I have played in two years. I played a 79.    Currently in Pain? No/denies                [facility] OT Assessment - 03/14/21 0954       Assessment   Medical Diagnosis Right shoulder pain/stiffness    Referring Provider (OT) Phillips Odor, MD      Precautions   Precautions None      Prior Function   Level of Independence Independent      ROM / Strength   AROM / PROM / Strength AROM;PROM;Strength      Palpation   Palpation comment min fascial restrictions noted in right upper arm, upper trapezius, and scapularis region.      AROM   Overall AROM Comments Assessed seated. IR/er adducted    AROM Assessment Site Shoulder    Right/Left Shoulder Right    Right Shoulder Flexion 101 Degrees   previous: 80   Right Shoulder ABduction 90 Degrees   previous: 75   Right Shoulder Internal Rotation 90 Degrees   previous: same   Right Shoulder External Rotation 46  Degrees   previous: 40     PROM   Overall PROM Comments Assessed supine. IR/er adducted    PROM Assessment Site Shoulder    Right/Left Shoulder Right    Right Shoulder Flexion 160 Degrees   previous: 142   Right Shoulder ABduction 180 Degrees   previous:134   Right Shoulder Internal Rotation 90 Degrees   previous: 90   Right Shoulder External Rotation 70 Degrees   previous: 60     Strength   Overall Strength Comments Assessed seated. IR/er adducted    Strength Assessment Site Shoulder    Right/Left Shoulder Right    Right Shoulder Flexion 3+/5   previous: 3-/5   Right Shoulder ABduction 3+/5   previous: 3-/5   Right Shoulder Internal Rotation 4+/5   previous: 3/5   Right Shoulder External Rotation 4+/5   previous: 3-/5                     OT Treatments/Exercises (OP) - 03/14/21 1050       Exercises   Exercises Shoulder      Shoulder Exercises: Supine   Protraction PROM;5 reps    Horizontal ABduction PROM;5 reps  External Rotation PROM;5 reps    Internal Rotation PROM;5 reps    Flexion PROM;5 reps    ABduction PROM;5 reps      Manual Therapy   Manual Therapy Myofascial release    Manual therapy comments Manual therapy completed prior to exercises.    Myofascial Release Myofascial release and manual stretching completed to right upper arm, upper trapezius, and scapularis region to decrease fascial restrictions and increase joint mobility in a pain free zone.                    OT Education - 03/14/21 1051     Education Details reviewed therapy goals and HEP. Recommended that patient continue with AA/ROM shoulder exerises. Table slides are optional.    Person(s) Educated Patient    Methods Explanation    Comprehension Verbalized understanding              OT Short Term Goals - 03/14/21 1011       OT SHORT TERM GOAL #1   Title Patient will be educated and independent with HEP in order to faciliate his progress in therapy and return to  his right UE as his nondominant extremity for all daily tasks.    Time 4    Period Weeks    Status Achieved    Target Date 03/16/21      OT SHORT TERM GOAL #2   Title Patient will increase his RUE A/ROM to 50% range as possible in order to increase his ability to swing his golf club with less difficulty.    Time 4    Period Weeks    Status Achieved      OT SHORT TERM GOAL #3   Title Patient will report a decrease in pain level of approximately 4/10 or less when using his RUE during reaching tasks.    Time 4    Period Weeks    Status Achieved      OT SHORT TERM GOAL #4   Title Patient will decrease his right UE fascial restrictions to min amount in order to increase his functional mobility needed to complete reaching at shoulder level.    Time 4    Period Weeks    Status Achieved                      Plan - 03/14/21 1052     Clinical Impression Statement A: reassessment completed this date. patient reports that he is able to play golf better than coming to therapy. He is able to see an improvement in his RUE ROM overall. he is using his right arm more as his nondominant extremity and reaching for items without even realizing it. He no longer keeps his RUE protected at his side. His passive and active ROM have both improved as well as his shoulder strength. All therapy goals have been met. 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 HEP was reviewed. D/C was recommended and patient agreed.    Body Structure / Function / Physical Skills ADL;Strength;Pain;ROM;Mobility;Fascial restriction;UE functional use;Tone    Plan P: D/C from OT services with HEP.    Consulted and Agree with Plan of Care Patient             Patient will benefit from skilled therapeutic intervention in order to improve the following deficits and impairments:   Body Structure / Function / Physical Skills:  ADL, Strength, Pain, ROM, Mobility,  Fascial restriction, UE functional use, Tone       Visit Diagnosis: Chronic right shoulder pain  Stiffness of right shoulder, not elsewhere classified  Other symptoms and signs involving the musculoskeletal system    Problem List There are no problems to display for this patient.   OCCUPATIONAL THERAPY DISCHARGE SUMMARY  Visits from Start of Care: 7  Current functional level related to goals / functional outcomes: See above   Remaining deficits: Pt is now demonstrating baseline A/ROM and strength for his RUE. See above   Education / Equipment: AA/ROM HEP   Patient agrees to discharge. Patient goals were met. Patient is being discharged due to meeting the stated rehab goals.Ailene Ravel, OTR/L,CBIS  [phone]   03/14/2021, 10:55 AM  [place] [address] [place], [place], [address] Phone: [phone]   Fax:  [phone]  Name: Khup Sapia MRN: [id number] Date of Birth: 1945-11-08

## 2022-01-07 IMAGING — MR MR CERVICAL SPINE W/O CM
5 series · 39 of 48 positions shown · non-contrast
Comparison: None.

CLINICAL DATA: Neck pain radiating into upper extremities.

EXAM:
MRI CERVICAL SPINE WITHOUT CONTRAST
TECHNIQUE: Multiplanar, multisequence MR imaging of the cervical spine was
performed. No intravenous contrast was administered.

[Series 5: T2 · sagittal · 3.0mm · 0.69mm/px · 6 of 13 slices shown (1 of 2)]
[im 1/13]
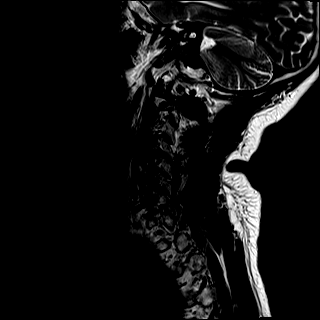
[im 3/13]
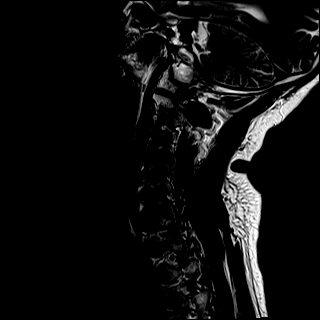
[im 5/13]
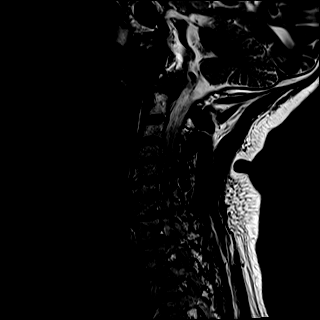
[im 8/13]
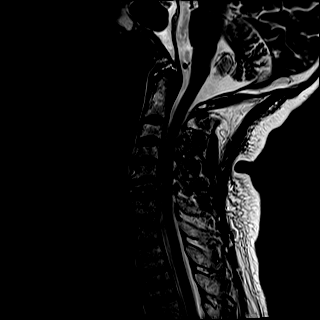
[im 10/13]
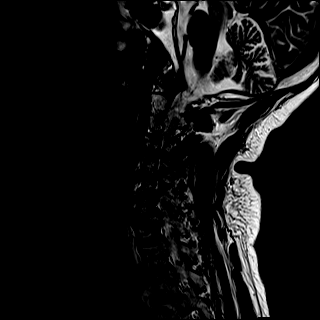
[im 13/13]
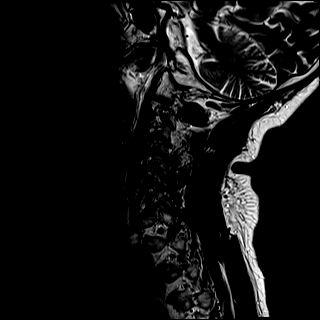

[Series 6: T1 · sagittal · 3.0mm · 0.86mm/px · 6 of 13 slices shown]
[im 1/13]
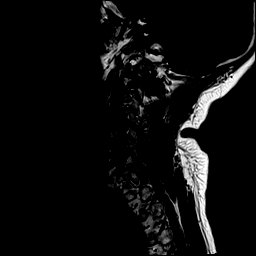
[im 3/13]
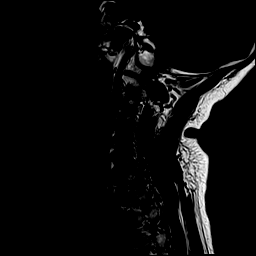
[im 5/13]
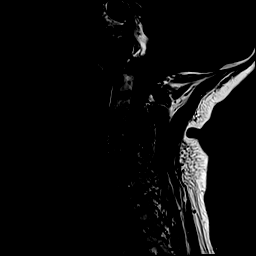
[im 8/13]
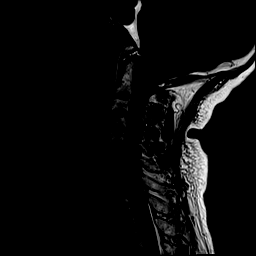
[im 10/13]
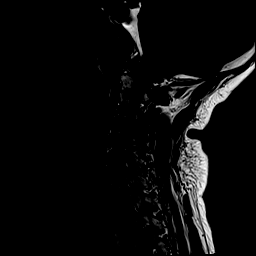
[im 13/13]
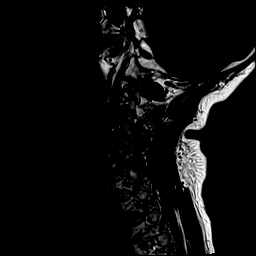

[Series 7: STIR · sagittal · 3.0mm · 0.69mm/px · 6 of 13 slices shown]
[im 1/13]
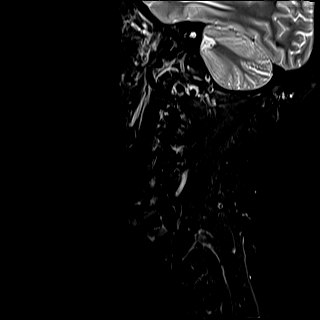
[im 3/13]
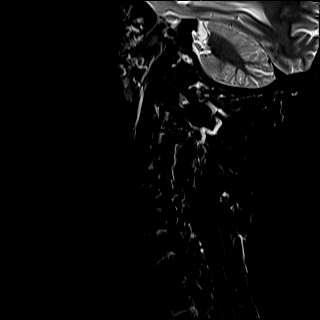
[im 5/13]
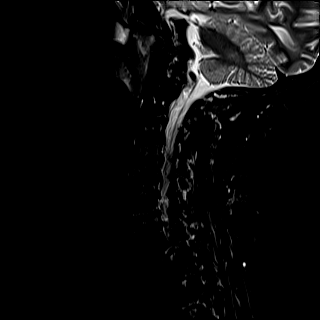
[im 8/13]
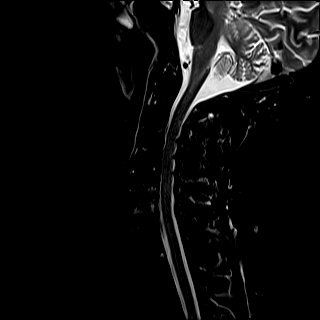
[im 10/13]
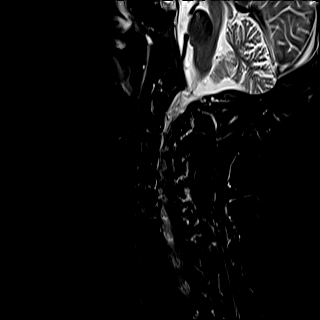
[im 13/13]
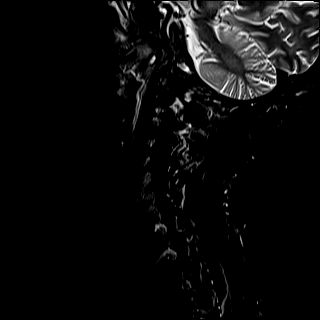

[Series 8: T2 · axial · 3.0mm · 0.70mm/px · z∈[-167,-63]mm · 13 of 34 slices shown (2 of 2)]
[im 1/34]
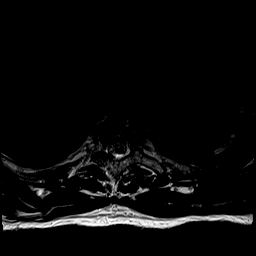
[im 3/34]
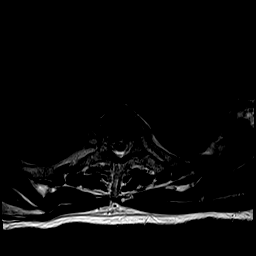
[im 5/34]
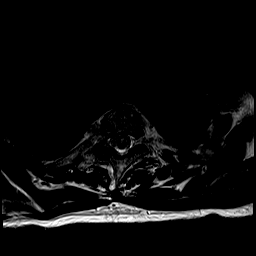
[im 8/34]
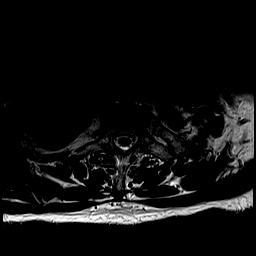
[im 10/34]
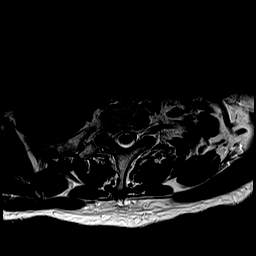
[im 12/34]
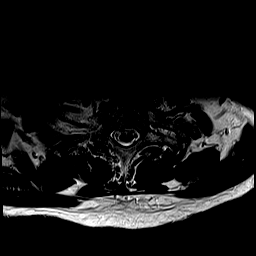
[im 15/34]
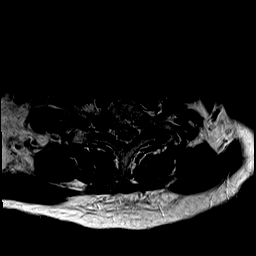
[im 17/34]
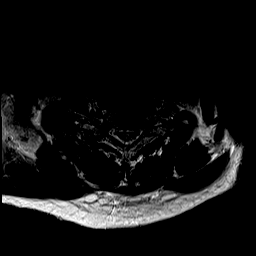
[im 19/34]
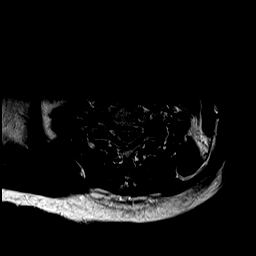
[im 22/34]
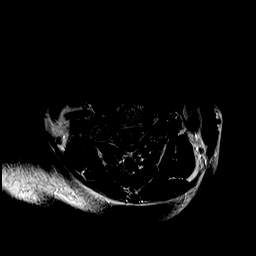
[im 24/34]
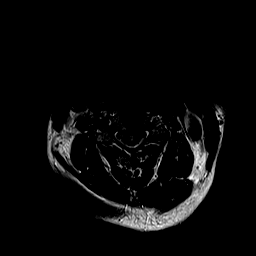
[im 29/34]
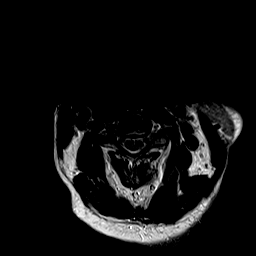
[im 34/34]
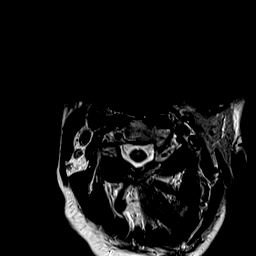

[Series 9: GRE · axial · 3.0mm · 0.35mm/px · z∈[-167,-63]mm · 8 of 34 slices shown]
[im 1/34]
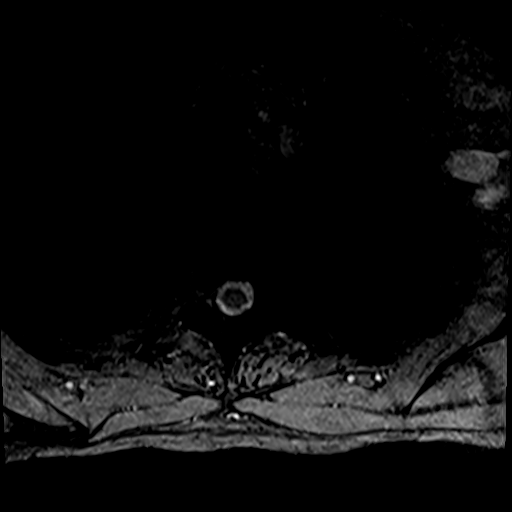
[im 5/34]
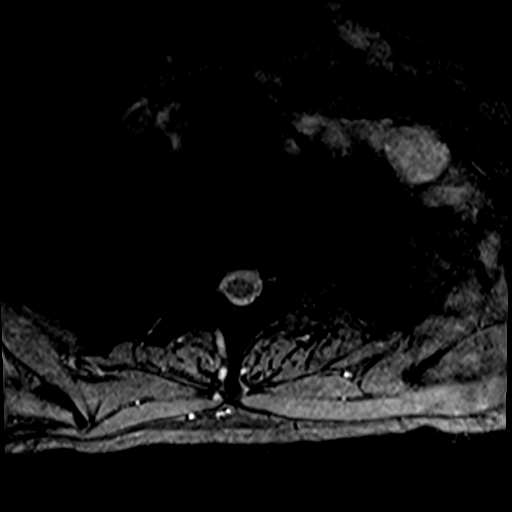
[im 10/34]
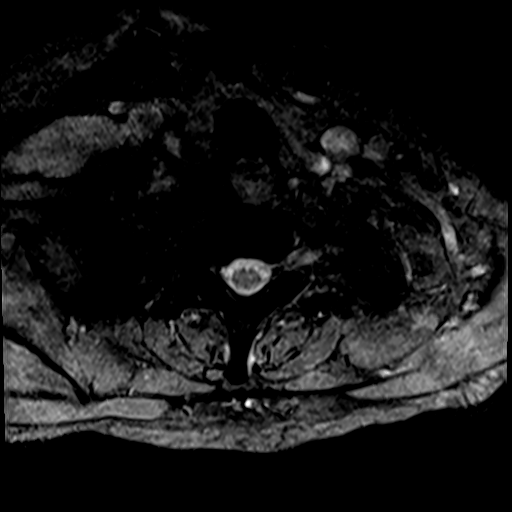
[im 15/34]
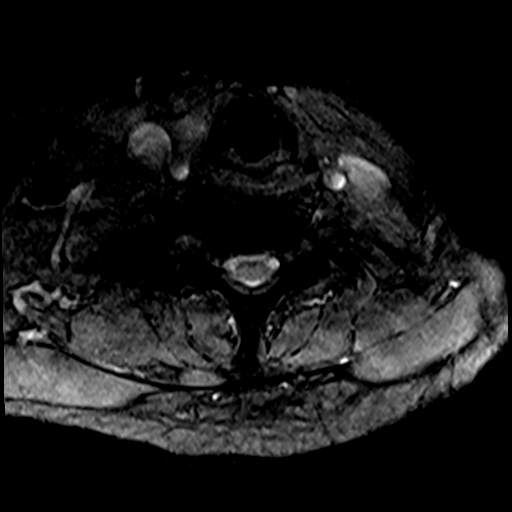
[im 19/34]
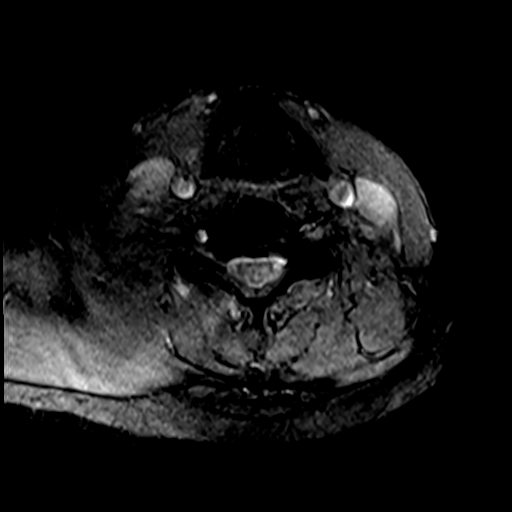
[im 24/34]
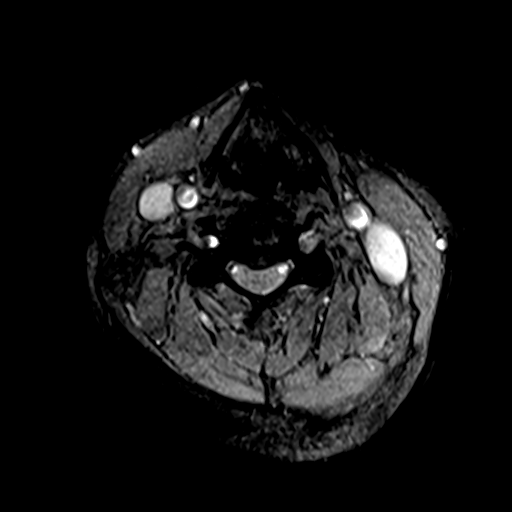
[im 29/34]
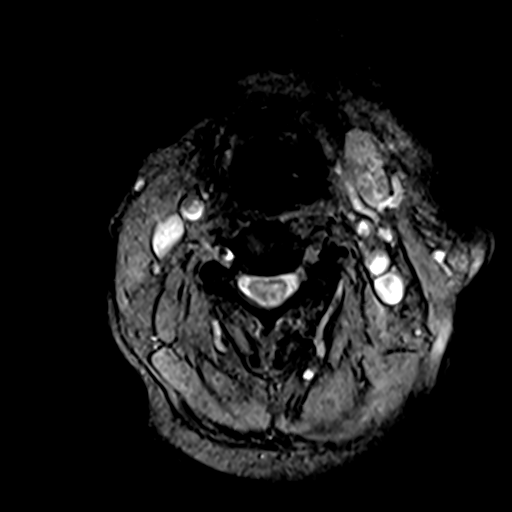
[im 34/34]
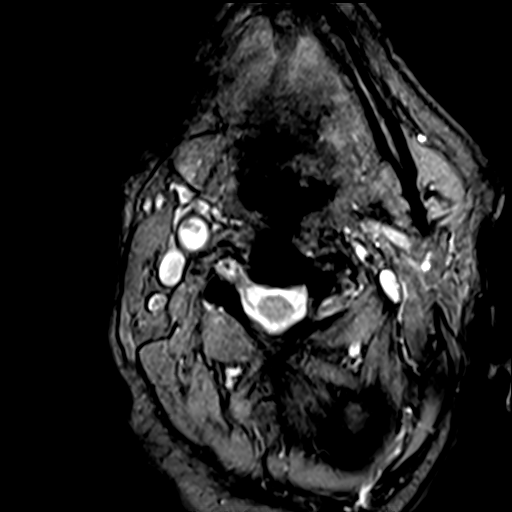

[39 of 48 positions shown; findings below may reference images not displayed]

FINDINGS: Alignment: Straightening of lordosis.

Vertebrae: Minimal Modic type 2 endplate degenerative changes. No
fracture or aggressive osseous lesion.

Cord: Normal signal and morphology.

Posterior Fossa, vertebral arteries: Negative.

Disc levels: Multilevel desiccation and disc space loss. Prominent
Schmorl's node formation at the C5 level.

C2-3: No significant disc bulge. Patent spinal canal and neural
foramen.

C3-4: Small disc osteophyte complex with uncovertebral and facet
degenerative spurring. Patent neural foramen. Mild spinal canal
narrowing.

C4-5: Disc osteophyte complex with superimposed central protrusion
abutting the ventral cord. Right predominant uncovertebral and facet
hypertrophy. Mild spinal canal and bilateral neural foraminal
narrowing.

C5-6: Disc osteophyte complex with uncovertebral and facet
hypertrophy. Mild spinal canal, mild right and moderate left neural
foraminal narrowing.

C6-7: Disc osteophyte complex with uncovertebral and facet
hypertrophy. Mild spinal canal and left neural foraminal narrowing.
Patent right neural foramen.

C7-T1: Small disc osteophyte complex with uncovertebral and facet
hypertrophy. Mild spinal canal and right neural foraminal narrowing.
Patent left neural foramen.

Paraspinal tissues: Negative.
IMPRESSION: Mild spinal canal narrowing at the C3-T1 level.

Moderate left C5-6 neural foraminal narrowing.

Mild bilateral C4-5, right C5-6, left C6-7 and right C7-T1 neural
foraminal narrowing.
